# Patient Record
Sex: Female | Born: 1990 | Race: White | Hispanic: No | Marital: Married | Smoking: Former smoker
Health system: Southern US, Community
[De-identification: ages and names within clinical notes are randomized; demographics above are authoritative.]

## PROBLEM LIST (undated history)

## (undated) DIAGNOSIS — F329 Major depressive disorder, single episode, unspecified: Secondary | ICD-10-CM

## (undated) DIAGNOSIS — F419 Anxiety disorder, unspecified: Secondary | ICD-10-CM

## (undated) DIAGNOSIS — F4310 Post-traumatic stress disorder, unspecified: Secondary | ICD-10-CM

## (undated) HISTORY — PX: NO PAST SURGERIES: SHX2092

---

## 2014-09-23 ENCOUNTER — Encounter (INDEPENDENT_AMBULATORY_CARE_PROVIDER_SITE_OTHER): Payer: Self-pay

## 2014-09-23 ENCOUNTER — Ambulatory Visit (INDEPENDENT_AMBULATORY_CARE_PROVIDER_SITE_OTHER): Payer: Enrolled Prime—HMO | Admitting: Family

## 2014-09-23 VITALS — BP 115/74 | HR 78 | Temp 98.2°F | Resp 16 | Ht 63.0 in | Wt 106.0 lb

## 2014-09-23 DIAGNOSIS — H6123 Impacted cerumen, bilateral: Secondary | ICD-10-CM

## 2014-09-23 NOTE — Patient Instructions (Signed)
Thank you for choosing [facility]  Please follow up with your primary care doctor in 3-4 days.   Return to the clinic or Emergency Department for worsening symptoms.    Please remember to wash your hands it is the best way to reduce illness and the spread of germs.           Earwax, Home Treatment    Everyone produces earwax from the lining of the ear canal. It serves to lubricate and protect the ear. The wax that forms in the canal naturally moves toward the outside of the ear and falls out. Sometimes the ear canal may contain too much wax. This can cause a blockage and loss of hearing. Directions are given below for home treatment.  Home care  If your doctor has advised you to remove a wax blockage yourself, follow these directions:   Unless a medicine was prescribed, you may use an over-the-counter product made for clearing earwax. These contain carbamide peroxide. Lie down with the blocked ear facing upward. Apply one dropper full of medicine and wait a few minutes. Grasp the outer ear and wiggle itto helpthe solutionenter the canal.   Lean over a sink or basin with the blocked ear facing downward. Use abulb syringe filled with warm (not hot or cold) water to rinse the ear several times. Use gentle pressure only.   If you are having trouble draining the water out of your ear canal, put a few drops of rubbing alcohol (isopropyl alcohol) into the ear canal. This will help remove the remaining water.   Repeat this procedure once a day for up to three days, or until your hearing is back to normal. Do not use this treatment for more than three days in a row.  Don'ts   Don't use cold water to rinse the ear. This will make you dizzy.   Don't perform this procedure if you have an ear infection.   Don't perform this procedure if you have a ruptured eardrum.   Don't use cotton swabs, matches, hairpins, keys, or other objects to "clean" the ear canal. This can cause infection of the ear canal or  rupturethe eardrum. Because of their size and shape, cotton swabs can push earwax deeper into the ear canal instead of removing it.  Follow-up care  Follow up with your health care provider if you are not improving after three cleaning attempts, or as advised.  When to seek medical advice  Call your health care provider right away if any of these occur:   Worsening ear pain   Fever of 101F (38.3C) or higher, or as directed by your health care provider   Hearing does not return to normal after three days of treatment   Fluid drainage or bleeding from the ear canal   Swelling, redness, or tenderness of the outer ear   Headache, neck pain, or stiff neck   2000-2015 [employer] [address], [place] [address]. All rights reserved. This information is not intended as a substitute for professional medical care. Always follow your healthcare professional's instructions.

## 2014-09-23 NOTE — Progress Notes (Signed)
Subjective:       Patient ID: Jeanette Fitzgerald is a 24 y.o. female present with complaint of not being able to hear well. Symptoms has been going on for about a year but has gotten worse since week.  Ear Fullness   There is pain in both ears. The current episode started more than 1 year ago. The problem occurs constantly. The problem has been gradually worsening. There has been no fever. Associated symptoms include hearing loss. Pertinent negatives include no abdominal pain, coughing, diarrhea, ear discharge, headaches, neck pain, rash, rhinorrhea, sore throat or vomiting. She has tried nothing for the symptoms. There is no history of a chronic ear infection or hearing loss.       The following portions of the patient's history were reviewed and updated as appropriate: allergies, current medications, past family history, past medical history, past social history, past surgical history and problem list.    Review of Systems   Constitutional: Negative.    HENT: Positive for hearing loss. Negative for ear discharge, rhinorrhea and sore throat.    Eyes: Negative.    Respiratory: Negative.  Negative for cough.    Cardiovascular: Negative.    Gastrointestinal: Negative.  Negative for vomiting, abdominal pain and diarrhea.   Endocrine: Positive for polyuria.   Genitourinary: Negative.    Musculoskeletal: Negative.  Negative for neck pain.   Skin: Negative for rash.   Neurological: Negative.  Negative for headaches.         Objective:     Physical Exam   Nursing note and vitals reviewed.  Constitutional: She is oriented to person, place, and time. She appears well-developed and well-nourished.   HENT:   Head: Normocephalic and atraumatic.   Cerumen impaction in bilateral ears     Eyes: Pupils are equal, round, and reactive to light.   Neck: Neck supple.   Cardiovascular: Normal rate and regular rhythm.    Pulmonary/Chest: Effort normal.   Abdominal: Soft.   Musculoskeletal: Normal range of motion.   Neurological: She is  alert and oriented to person, place, and time.   Skin: Skin is warm and dry.   Psychiatric: She has a normal mood and affect.   BP 115/74 mmHg  Pulse 78  Temp(Src) 98.2 F (36.8 C) (Tympanic)  Resp 16  Ht 1.6 m (5\' 3" )  Wt 48.081 kg (106 lb)  BMI 18.78 kg/m2  LMP 09/09/2014 (Approximate)          Assessment:       1. Bilateral impacted cerumen           Plan:       Ear irrigated and inspected after irrigation   Patient can now hear better   Return to urgent care clinic for worsening symptoms   Follow up with your PCP in 3-4 days  Pt in agreement with discharge plan, all questions answered

## 2015-06-13 ENCOUNTER — Emergency Department
Admission: EM | Admit: 2015-06-13 | Discharge: 2015-06-14 | Disposition: A | Discharge status: Home / Self Care | Attending: Emergency Medicine | Admitting: Emergency Medicine

## 2015-06-13 ENCOUNTER — Emergency Department

## 2015-06-13 ENCOUNTER — Encounter: Payer: Self-pay | Admitting: Emergency Medicine

## 2015-06-13 DIAGNOSIS — R1032 Left lower quadrant pain: Secondary | ICD-10-CM | POA: Insufficient documentation

## 2015-06-13 DIAGNOSIS — R109 Unspecified abdominal pain: Secondary | ICD-10-CM

## 2015-06-13 DIAGNOSIS — R5383 Other fatigue: Secondary | ICD-10-CM | POA: Insufficient documentation

## 2015-06-13 DIAGNOSIS — R5381 Other malaise: Secondary | ICD-10-CM

## 2015-06-13 LAB — COMPREHENSIVE METABOLIC PANEL
ALBUMIN: 5 g/dL (ref 3.5–5.0)
ALK PHOS: 51 U/L (ref 38–126)
ALT: 10 U/L — ABNORMAL LOW (ref 14–54)
ANION GAP: 4 — AB (ref 5–15)
AST: 21 U/L (ref 15–41)
BUN: 12 mg/dL (ref 6–20)
CALCIUM: 9.2 mg/dL (ref 8.9–10.3)
CHLORIDE: 106 mmol/L (ref 101–111)
CO2: 25 mmol/L (ref 22–32)
Creatinine, Ser: 0.61 mg/dL (ref 0.44–1.00)
GFR calc Af Amer: >60 mL/min (ref >60)
GFR calc non Af Amer: >60 mL/min (ref >60)
GLUCOSE: 94 mg/dL (ref 65–99)
Potassium: 3.5 mmol/L (ref 3.5–5.1)
SODIUM: 135 mmol/L (ref 135–145)
Total Bilirubin: 2 mg/dL — ABNORMAL HIGH (ref 0.3–1.2)
Total Protein: 8.1 g/dL (ref 6.5–8.1)

## 2015-06-13 LAB — URINALYSIS COMPLETE WITH MICROSCOPIC (ARMC ONLY)
BACTERIA UA: NONE SEEN
Bilirubin Urine: NEGATIVE
Glucose, UA: NEGATIVE mg/dL
Hgb urine dipstick: NEGATIVE
Ketones, ur: NEGATIVE mg/dL
Leukocytes, UA: NEGATIVE
Nitrite: NEGATIVE
PH: 6 (ref 5.0–8.0)
PROTEIN: NEGATIVE mg/dL
Specific Gravity, Urine: 1.017 (ref 1.005–1.030)

## 2015-06-13 LAB — CBC
HEMATOCRIT: 40.2 % (ref 35.0–47.0)
HEMOGLOBIN: 13.6 g/dL (ref 12.0–16.0)
MCH: 29.5 pg (ref 26.0–34.0)
MCHC: 33.9 g/dL (ref 32.0–36.0)
MCV: 87.2 fL (ref 80.0–100.0)
Platelets: 250 10*3/uL (ref 150–440)
RBC: 4.61 MIL/uL (ref 3.80–5.20)
RDW: 12.6 % (ref 11.5–14.5)
WBC: 7.1 10*3/uL (ref 3.6–11.0)

## 2015-06-13 LAB — MONONUCLEOSIS SCREEN: MONO SCREEN: NEGATIVE

## 2015-06-13 LAB — POCT PREGNANCY, URINE: Preg Test, Ur: NEGATIVE

## 2015-06-13 LAB — LIPASE, BLOOD: Lipase: 22 U/L (ref 11–51)

## 2015-06-13 LAB — FIBRIN DERIVATIVES D-DIMER (ARMC ONLY): FIBRIN DERIVATIVES D-DIMER (ARMC): 187 (ref 0–499)

## 2015-06-13 MED ORDER — NAPROXEN 500 MG PO TABS: 500.0000 mg | ORAL_TABLET | Freq: Two times a day (BID) | ORAL | Status: DC

## 2015-06-13 MED ORDER — ONDANSETRON 4 MG PO TBDP: 4.0000 mg | ORAL_TABLET | Freq: Three times a day (TID) | ORAL | Status: DC | PRN

## 2015-06-13 MED ORDER — FAMOTIDINE 20 MG PO TABS: 20.0000 mg | ORAL_TABLET | Freq: Two times a day (BID) | ORAL | Status: DC

## 2015-06-13 NOTE — ED Notes (Signed)
Pt reports she has left side abd pain for 1 week. Pt has IUD.  Last menses was 2 weeks ago and since menses, pt has intermittent abd pain.   No n/v/d.  Pt also has back pain with cramping.  No dysuria.  Pt alert.

## 2015-06-13 NOTE — ED Notes (Signed)
Pt reports 8/10 abdominal pain in LLQ with weakness, poor appetite  and nausea for the last 2 weeks.  Pt has IUD implant approx 3 years ago and concerned that pain is related to that. Pt denies abdominal surgeries.    Pt reports new onset of reflux with these two weeks,  Pain in triage spiking to "10 out of 10"

## 2015-06-13 NOTE — ED Provider Notes (Signed)
-----------------------------------------   11:43 PM on 06/13/2015 -----------------------------------------   Blood pressure 132/80, temperature 98.2 F (36.8 C), temperature source Oral, resp. rate 16, height 5\' 3"  (1.6 m), weight 110 lb (49.896 kg), last menstrual period 05/30/2015, SpO2 99 %.  Assuming care from Dr. Scotty CourtStafford.  In short, Erin Lindsey is a 25 y.o. female with a chief complaint of Abdominal Pain .  Refer to the original H&P for additional details.  The current plan of care is to follow the results of the D-dimer test which was negative. The patient was prepped for discharge. Appropriate discharge paperwork etc. was distributed by the nursing staff. Dimer test but did not reach a height that was of medical or clinical concern.   Jennye MoccasinBrian S Zanobia Griebel, MD 06/13/15 [phone]

## 2015-06-13 NOTE — ED Provider Notes (Signed)
[facility] Emergency Department Provider Note  ____________________________________________  Time seen: 9:10 PM  I have reviewed the triage vital signs and the nursing notes.   HISTORY  Chief Complaint Abdominal Pain    HPI Erin Lindsey is a 25 y.o. female who complains of severe left lower quadrant abdominal pain with generalized weakness poor appetite and nausea for the past 2 weeks. She also reports severe fatigue over the last 2 or 3 months. Concern that this may be related to her IUD that she has had for the past 3 years. No abnormal vaginal bleeding or discharge. No dysuria frequency urgency. No nausea vomiting or diarrhea. She also reports some chest tightness which is present in the anterior chest and feels like heartburn and is nonradiating today that seems to hurt a little bit when she breathes. She also feels mildly short of breath. She took some antacids at home which feels like it did help a little bit. Denies any personal or family history of DVT or PE. No recent travel, hospitalizations or surgeries.     History reviewed. No pertinent past medical history.   There are no active problems to display for this patient.    History reviewed. No pertinent past surgical history.   Current Outpatient Rx  Name  Route  Sig  Dispense  Refill  . famotidine (PEPCID) 20 MG tablet   Oral   Take 1 tablet (20 mg total) by mouth 2 (two) times daily.   60 tablet   0   . naproxen (NAPROSYN) 500 MG tablet   Oral   Take 1 tablet (500 mg total) by mouth 2 (two) times daily with a meal.   20 tablet   0   . ondansetron (ZOFRAN ODT) 4 MG disintegrating tablet   Oral   Take 1 tablet (4 mg total) by mouth every 8 (eight) hours as needed for nausea or vomiting.   20 tablet   0    None  Allergies Review of patient's allergies indicates no known allergies.   History reviewed. No pertinent family history.  Social History Social History   Substance Use Topics  . Smoking status: Never Smoker   . Smokeless tobacco: None  . Alcohol Use: No    Review of Systems  Constitutional:   No fever or chills. No weight changes Eyes:   No vision changes.  ENT:   No sore throat. No rhinorrhea. Cardiovascular:   Positive as above chest pain. Respiratory:   No dyspnea or cough. Gastrointestinal:   Negative for abdominal pain, vomiting and diarrhea.  No BRBPR or melena. Genitourinary:   Negative for dysuria or difficulty urinating. Musculoskeletal:   Negative for focal pain or swelling Skin:   Negative for rash. Neurological:   Negative for headaches, focal weakness or numbness.  10-point ROS otherwise negative.  ____________________________________________   PHYSICAL EXAM:  VITAL SIGNS: ED Triage Vitals  Enc Vitals Group     BP 06/13/15 1949 132/80 mmHg     Pulse --      Resp 06/13/15 1949 16     Temp 06/13/15 1949 98.2 F (36.8 C)     Temp Source 06/13/15 1949 Oral     SpO2 06/13/15 1949 99 %     Weight 06/13/15 1949 110 lb (49.896 kg)     Height 06/13/15 1949 5\' 3"  (1.6 m)     Head Cir --      Peak Flow --      Pain Score 06/13/15 2001  8     Pain Loc --      Pain Edu? --      Excl. in GC? --     Vital signs reviewed, nursing assessments reviewed.   Constitutional:   Alert and oriented. Well appearing and in no distress. Eyes:   No scleral icterus. No conjunctival pallor. PERRL. EOMI ENT   Head:   Normocephalic and atraumatic.   Nose:   No congestion/rhinnorhea. No septal hematoma   Mouth/Throat:   MMM, no pharyngeal erythema. No peritonsillar mass.    Neck:   No stridor. No SubQ emphysema. No meningismus. Hematological/Lymphatic/Immunilogical:   No cervical lymphadenopathy. Cardiovascular:   RRR. Symmetric bilateral radial and DP pulses.  No murmurs.  Respiratory:   Normal respiratory effort without tachypnea nor retractions. Breath sounds are clear and equal bilaterally. No  wheezes/rales/rhonchi. Gastrointestinal:   Soft with very focal and exquisite left upper quadrant tenderness. No appreciable splenomegaly.. Non distended. There is no CVA tenderness.  No rebound, rigidity, or guarding. Genitourinary:   deferred Musculoskeletal:   Nontender with normal range of motion in all extremities. No joint effusions.  No lower extremity tenderness.  No edema. Chest wall nontender. Neurologic:   Normal speech and language.  CN 2-10 normal. Motor grossly intact. No gross focal neurologic deficits are appreciated.  Skin:    Skin is warm, dry and intact. No rash noted.  No petechiae, purpura, or bullae. Psychiatric:   Mood and affect are normal. ____________________________________________    LABS (pertinent positives/negatives) (all labs ordered are listed, but only abnormal results are displayed) Labs Reviewed  COMPREHENSIVE METABOLIC PANEL - Abnormal; Notable for the following:    ALT 10 (*)    Total Bilirubin 2.0 (*)    Anion gap 4 (*)    All other components within normal limits  URINALYSIS COMPLETEWITH MICROSCOPIC ([facility] ONLY) - Abnormal; Notable for the following:    Color, Urine YELLOW (*)    APPearance CLEAR (*)    Squamous Epithelial / LPF 0-5 (*)    All other components within normal limits  LIPASE, BLOOD  CBC  MONONUCLEOSIS SCREEN  FIBRIN DERIVATIVES D-DIMER ([facility] ONLY)  POCT PREGNANCY, URINE   ____________________________________________   EKG    ____________________________________________    RADIOLOGY  Chest x-ray unremarkable  ____________________________________________   PROCEDURES   ____________________________________________   INITIAL IMPRESSION / ASSESSMENT AND PLAN / ED COURSE  Pertinent labs & imaging results that were available during my care of the patient were reviewed by me and considered in my medical decision making (see chart for details).  Patient presents with multiple symptoms, most significant of which  is malaise generalized weakness and profound fatigue. Vital signs are normal. Workup is negative. Initial labs including chemistries blood counts pregnancy test and urinalysis all unremarkable. Mononucleosis screen and d-dimer pending. If no severe findings are identified, the patient is stable for outpatient follow-up.    ----------------------------------------- 11:06 PM on 06/13/2015 -----------------------------------------  Monoscreen negative. Due to delay in lab actually running the D-dimer test, it is still pending. Case signed out to Dr. Huel Cote to follow-up on results. Results discussed with the patient understands that if d-dimer is negative, we'll discharge her home to follow up with primary care. She is working on getting a primary care doctor in this area. She just moved to the area. If d-dimer is elevated we'll proceed with CT angiogram of the chest. Continue NSAIDs and antacids for symptomatic management in the meantime. Remains hemodynamically stable in the ED.   ____________________________________________  FINAL CLINICAL IMPRESSION(S) / ED DIAGNOSES  Final diagnoses:  Left sided abdominal pain  Malaise and fatigue      Sharman Cheek, MD 06/13/15 2307

## 2015-06-13 NOTE — Discharge Instructions (Signed)
Abdominal Pain, Adult Many things can cause abdominal pain. Usually, abdominal pain is not caused by a disease and will improve without treatment. It can often be observed and treated at home. Your health care provider will do a physical exam and possibly order blood tests and X-rays to help determine the seriousness of your pain. However, in many cases, more time must pass before a clear cause of the pain can be found. Before that point, your health care provider may not know if you need more testing or further treatment. HOME CARE INSTRUCTIONS Monitor your abdominal pain for any changes. The following actions may help to alleviate any discomfort you are experiencing:  Only take over-the-counter or prescription medicines as directed by your health care provider.  Do not take laxatives unless directed to do so by your health care provider.  Try a clear liquid diet (broth, tea, or water) as directed by your health care provider. Slowly move to a bland diet as tolerated. SEEK MEDICAL CARE IF:  You have unexplained abdominal pain.  You have abdominal pain associated with nausea or diarrhea.  You have pain when you urinate or have a bowel movement.  You experience abdominal pain that wakes you in the night.  You have abdominal pain that is worsened or improved by eating food.  You have abdominal pain that is worsened with eating fatty foods.  You have a fever. SEEK IMMEDIATE MEDICAL CARE IF:  Your pain does not go away within 2 hours.  You keep throwing up (vomiting).  Your pain is felt only in portions of the abdomen, such as the right side or the left lower portion of the abdomen.  You pass bloody or black tarry stools. MAKE SURE YOU:  Understand these instructions.  Will watch your condition.  Will get help right away if you are not doing well or get worse.   This information is not intended to replace advice given to you by your health care provider. Make sure you discuss  any questions you have with your health care provider.   Document Released: 12/06/2004 Document Revised: 11/17/2014 Document Reviewed: 11/05/2012 Elsevier Interactive Patient Education 2016 Elsevier Inc. Fatigue Fatigue is feeling tired all of the time, a lack of energy, or a lack of motivation. Occasional or mild fatigue is often a normal response to activity or life in general. However, long-lasting (chronic) or extreme fatigue may indicate an underlying medical condition. HOME CARE INSTRUCTIONS  Watch your fatigue for any changes. The following actions may help to lessen any discomfort you are feeling:  Talk to your health care provider about how much sleep you need each night. Try to get the required amount every night.  Take medicines only as directed by your health care provider.  Eat a healthy and nutritious diet. Ask your health care provider if you need help changing your diet.  Drink enough fluid to keep your urine clear or pale yellow.  Practice ways of relaxing, such as yoga, meditation, massage therapy, or acupuncture.  Exercise regularly.   Change situations that cause you stress. Try to keep your work and personal routine reasonable.  Do not abuse illegal drugs.  Limit alcohol intake to no more than 1 drink per day for nonpregnant women and 2 drinks per day for men. One drink equals 12 ounces of beer, 5 ounces of wine, or 1 ounces of hard liquor.  Take a multivitamin, if directed by your health care provider. SEEK MEDICAL CARE IF:   Your fatigue   does not get better.  You have a fever.   You have unintentional weight loss or gain.  You have headaches.   You have difficulty:   Falling asleep.  Sleeping throughout the night.  You feel angry, guilty, anxious, or sad.   You are unable to have a bowel movement (constipation).   You skin is dry.   Your legs or another part of your body is swollen.  SEEK IMMEDIATE MEDICAL CARE IF:   You feel  confused.   Your vision is blurry.  You feel faint or pass out.   You have a severe headache.   You have severe abdominal, pelvic, or back pain.   You have chest pain, shortness of breath, or an irregular or fast heartbeat.   You are unable to urinate or you urinate less than normal.   You develop abnormal bleeding, such as bleeding from the rectum, vagina, nose, lungs, or nipples.  You vomit blood.   You have thoughts about harming yourself or committing suicide.   You are worried that you might harm someone else.    This information is not intended to replace advice given to you by your health care provider. Make sure you discuss any questions you have with your health care provider.   Document Released: 12/24/2006 Document Revised: 03/19/2014 Document Reviewed: 06/30/2013 Elsevier Interactive Patient Education 2016 Elsevier Inc.  

## 2016-01-12 ENCOUNTER — Encounter: Payer: Self-pay | Admitting: *Deleted

## 2016-01-12 ENCOUNTER — Ambulatory Visit
Admission: EM | Admit: 2016-01-12 | Discharge: 2016-01-12 | Disposition: A | Discharge status: Home / Self Care | Attending: Family Medicine | Admitting: Family Medicine

## 2016-01-12 DIAGNOSIS — F419 Anxiety disorder, unspecified: Secondary | ICD-10-CM | POA: Diagnosis not present

## 2016-01-12 DIAGNOSIS — R42 Dizziness and giddiness: Secondary | ICD-10-CM | POA: Diagnosis not present

## 2016-01-12 LAB — RAPID STREP SCREEN (MED CTR MEBANE ONLY): STREPTOCOCCUS, GROUP A SCREEN (DIRECT): NEGATIVE

## 2016-01-12 LAB — PREGNANCY, URINE: Preg Test, Ur: NEGATIVE

## 2016-01-12 MED ORDER — MECLIZINE HCL 12.5 MG PO TABS: 12.5000 mg | ORAL_TABLET | Freq: Three times a day (TID) | ORAL | 0 refills | Status: AC | PRN
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
## 2016-01-12 NOTE — Discharge Instructions (Signed)
Begin taking Prilosec or Prevacid over-the-counter daily for 14 days. Use Antivert when not performing activities requiring concentration and judgment do not take while driving.

## 2016-01-12 NOTE — ED Triage Notes (Signed)
Patient started having symptoms of dizzy, nausea, aches, and hot flashes 2 days ago. Additional symptom of sore throat present.  Patient also wants a pregnancy test.

## 2016-01-13 ENCOUNTER — Telehealth: Payer: Self-pay

## 2016-01-13 MED ORDER — LIDOCAINE VISCOUS 2 % MT SOLN: 20.0000 mL | Freq: Four times a day (QID) | OROMUCOSAL | 0 refills | Status: DC | PRN

## 2016-01-13 NOTE — Telephone Encounter (Signed)
Patient called in today and reports that she is no better. Patient states that sore throat is worse and would like to see if something could be done. I spoke with Dr. Judd Gaudieronty and he advised that she should start viscous lidocaine. I have sent medication in to pharmacy and patient advised. Patient advised that strep culture is still pending.

## 2016-01-15 ENCOUNTER — Telehealth: Payer: Self-pay | Admitting: *Deleted

## 2016-01-15 LAB — CULTURE, GROUP A STREP (THRC)

## 2016-01-15 NOTE — Telephone Encounter (Signed)
Called patient, verified DOB, communicated neg strep culture result. Advised patient to follow up with PCP or [facility] if symptom persist.

## 2016-01-18 ENCOUNTER — Encounter: Payer: Self-pay | Admitting: *Deleted

## 2016-01-18 ENCOUNTER — Ambulatory Visit
Admission: EM | Admit: 2016-01-18 | Discharge: 2016-01-18 | Disposition: A | Discharge status: Home / Self Care | Attending: Family Medicine | Admitting: Family Medicine

## 2016-01-18 DIAGNOSIS — B001 Herpesviral vesicular dermatitis: Secondary | ICD-10-CM

## 2016-01-18 DIAGNOSIS — F419 Anxiety disorder, unspecified: Secondary | ICD-10-CM | POA: Diagnosis not present

## 2016-01-18 HISTORY — DX: Anxiety disorder, unspecified: F41.9

## 2016-01-18 MED ORDER — VALACYCLOVIR HCL 1 G PO TABS: ORAL_TABLET | ORAL | 0 refills | Status: DC

## 2016-01-18 NOTE — ED Provider Notes (Signed)
[facility]    CSN: [id number] Arrival date & time: 01/18/16  1830     History   Chief Complaint Chief Complaint  Patient presents with  . Mouth Lesions  . Abdominal Pain  . Anxiety    HPI Erin Lindsey is a 25 y.o. female.   Patient is here with her SO and son. She reports being very stressed out and very anxious. She states she has a history of anxiety but she cannot take antidepressants. However she is on Lexapro 10 mg a day. She does not know what antidepressants she took before that she did not tolerate but she was fine on Lexapro until recently. Apparently a doctor in [place] initially placed her on Lexapro it was continued by a PCP in [place] who has given her multiple refills which she has been using. She states that recently the Lexapro does not seem to be effective anymore. She is planning to move in the next week or 2 she feels that her job has been treating her unfairly and is broken out in ulcerations in mouth which is also making her very upset and very concerned. She states that her husband was murdered in May in a robbery and since then her life and very extremely stressful.   She's had a history of anxiety no known history of surgeries. She currently smokes (she vapes). No pertinent family history pertinent to today's visit.   The history is provided by the patient and a significant other. No language interpreter was used.  Mouth Lesions  Location:  Upper lip and lower lip Quality:  Ulcerous Onset quality:  Sudden Severity:  Severe Duration:  4 days Progression:  Worsening Chronicity:  New Context: stress   Context: not a change in diet, not a change in medications, not medications, not a possible infection and not trauma   Relieved by:  Nothing Worsened by:  Nothing Abdominal Pain  Associated symptoms: no chest pain and no shortness of breath   Anxiety  This is a recurrent problem. The problem has been rapidly worsening. Associated symptoms  include abdominal pain. Pertinent negatives include no chest pain, no headaches and no shortness of breath. Treatments tried: lexapro.    Past Medical History:  Diagnosis Date  . Anxiety     There are no active problems to display for this patient.   History reviewed. No pertinent surgical history.  OB History    No data available       Home Medications    Prior to Admission medications   Medication Sig Start Date End Date Taking? Authorizing Provider  escitalopram (LEXAPRO) 10 MG tablet Take 10 mg by mouth daily.   Yes Historical Provider, MD  lidocaine (XYLOCAINE) 2 % solution Use as directed 20 mLs in the mouth or throat every 6 (six) hours as needed for mouth pain. 01/13/16  Yes Payton Mccallumrlando Conty, MD  meclizine (ANTIVERT) 12.5 MG tablet Take 1 tablet (12.5 mg total) by mouth 3 (three) times daily as needed for dizziness. 01/12/16  Yes Lutricia FeilWilliam P Roemer, PA-C  valACYclovir (VALTREX) 1000 MG tablet 2 tablet twice 12 hrs apart 01/18/16   Hassan RowanEugene Ketrina Boateng, MD    Family History History reviewed. No pertinent family history.  Social History Social History  Substance Use Topics  . Smoking status: Current Every Day Smoker    Types: E-cigarettes  . Smokeless tobacco: Never Used  . Alcohol use No     Allergies   Patient has no known allergies.   Review  of Systems Review of Systems  HENT: Positive for mouth sores.   Respiratory: Negative for shortness of breath.   Cardiovascular: Negative for chest pain.  Gastrointestinal: Positive for abdominal pain.  Neurological: Negative for headaches.  All other systems reviewed and are negative.    Physical Exam Triage Vital Signs ED Triage Vitals  Enc Vitals Group     BP 01/18/16 1840 111/82     Pulse Rate 01/18/16 1840 84     Resp 01/18/16 1840 16     Temp 01/18/16 1840 98.7 F (37.1 C)     Temp Source 01/18/16 1840 Oral     SpO2 01/18/16 1840 100 %     Weight --      Height --      Head Circumference --      Peak Flow --        Pain Score 01/18/16 1845 8     Pain Loc --      Pain Edu? --      Excl. in GC? --    No data found.   Updated Vital Signs BP 111/82 (BP Location: Left Arm)   Pulse 84   Temp 98.7 F (37.1 C) (Oral)   Resp 16   LMP 12/22/2015   SpO2 100%   Visual Acuity Right Eye Distance:   Left Eye Distance:   Bilateral Distance:    Right Eye Near:   Left Eye Near:    Bilateral Near:     Physical Exam  Constitutional: She is oriented to person, place, and time. She appears well-developed and well-nourished.  HENT:  Head: Normocephalic.  Right Ear: Hearing and external ear normal.  Left Ear: Hearing and external ear normal.  Nose: Nose normal. No mucosal edema.  Mouth/Throat: Uvula is midline. Oral lesions present. Normal dentition.  Multiple ulcerations present on the pink mucosa both upper and lower  Eyes: Pupils are equal, round, and reactive to light.  Neck: Normal range of motion.  Pulmonary/Chest: Effort normal.  Neurological: She is alert and oriented to person, place, and time.  Skin: Skin is warm and dry.  Psychiatric: Her speech is normal. Her mood appears anxious.  Vitals reviewed.    UC Treatments / Results  Labs (all labs ordered are listed, but only abnormal results are displayed) Labs Reviewed - No data to display  EKG  EKG Interpretation None       Radiology No results found.  Procedures Procedures (including critical care time)  Medications Ordered in UC Medications - No data to display   Initial Impression / Assessment and Plan / UC Course  I have reviewed the triage vital signs and the nursing notes.  Pertinent labs & imaging results that were available during my care of the patient were reviewed by me and considered in my medical decision making (see chart for details).  Clinical Course     We had a candid talk. I'm not sure if the mouth lesions are coming from a fever blisters or just other type of bowel ulcer lesions. Suspect  they're not herpetic however she is just so upset the getting worse that even though they've been there for 4 days we will try a round of Valtrex 2 tablets a.m. and 2 tablets in the p.m. of 1000 mg for total 4000 mg to see if it makes a difference. For the anxiety will give her day off tomorrow for her to calm down I'm not going to give any type of benzodiazepines but I  will recommend she contact the doctor who prescribed Lexapro and discussed with him about increasing the dosage from 10 mg to 20 mg of explained to the 20 mg Lexapro may calm her anxiety much more and help. Also explained to her that the Lexapro is antidepressant but does have some released and less significant side effects than probably  anyother the other SSRI.  She did request for me to take her out of work Thursday and Friday which I cannot do.  Final Clinical Impressions(s) / UC Diagnoses   Final diagnoses:  Fever blister  Anxiety disorder, unspecified type    New Prescriptions Discharge Medication List as of 01/18/2016  7:20 PM    START taking these medications   Details  valACYclovir (VALTREX) 1000 MG tablet 2 tablet twice 12 hrs apart, Normal       Note: This dictation was prepared with Dragon dictation along with smaller phrase technology. Any transcriptional errors that result from this process are unintentional.   Hassan RowanEugene Jasman Pfeifle, MD 01/18/16 2123

## 2016-01-18 NOTE — ED Triage Notes (Signed)
Patient started having symptoms of anxiety, mouth ulcers, and stomach burning 4 days ago. Patient reports being under a lot of stress.

## 2016-09-11 IMAGING — CR DG CHEST 2V
2 series · 2 of 2 positions shown · non-contrast
Comparison: None.

CLINICAL DATA: Central chest pain and dyspnea for several hours

EXAM:
CHEST  2 VIEW

[chest pa]
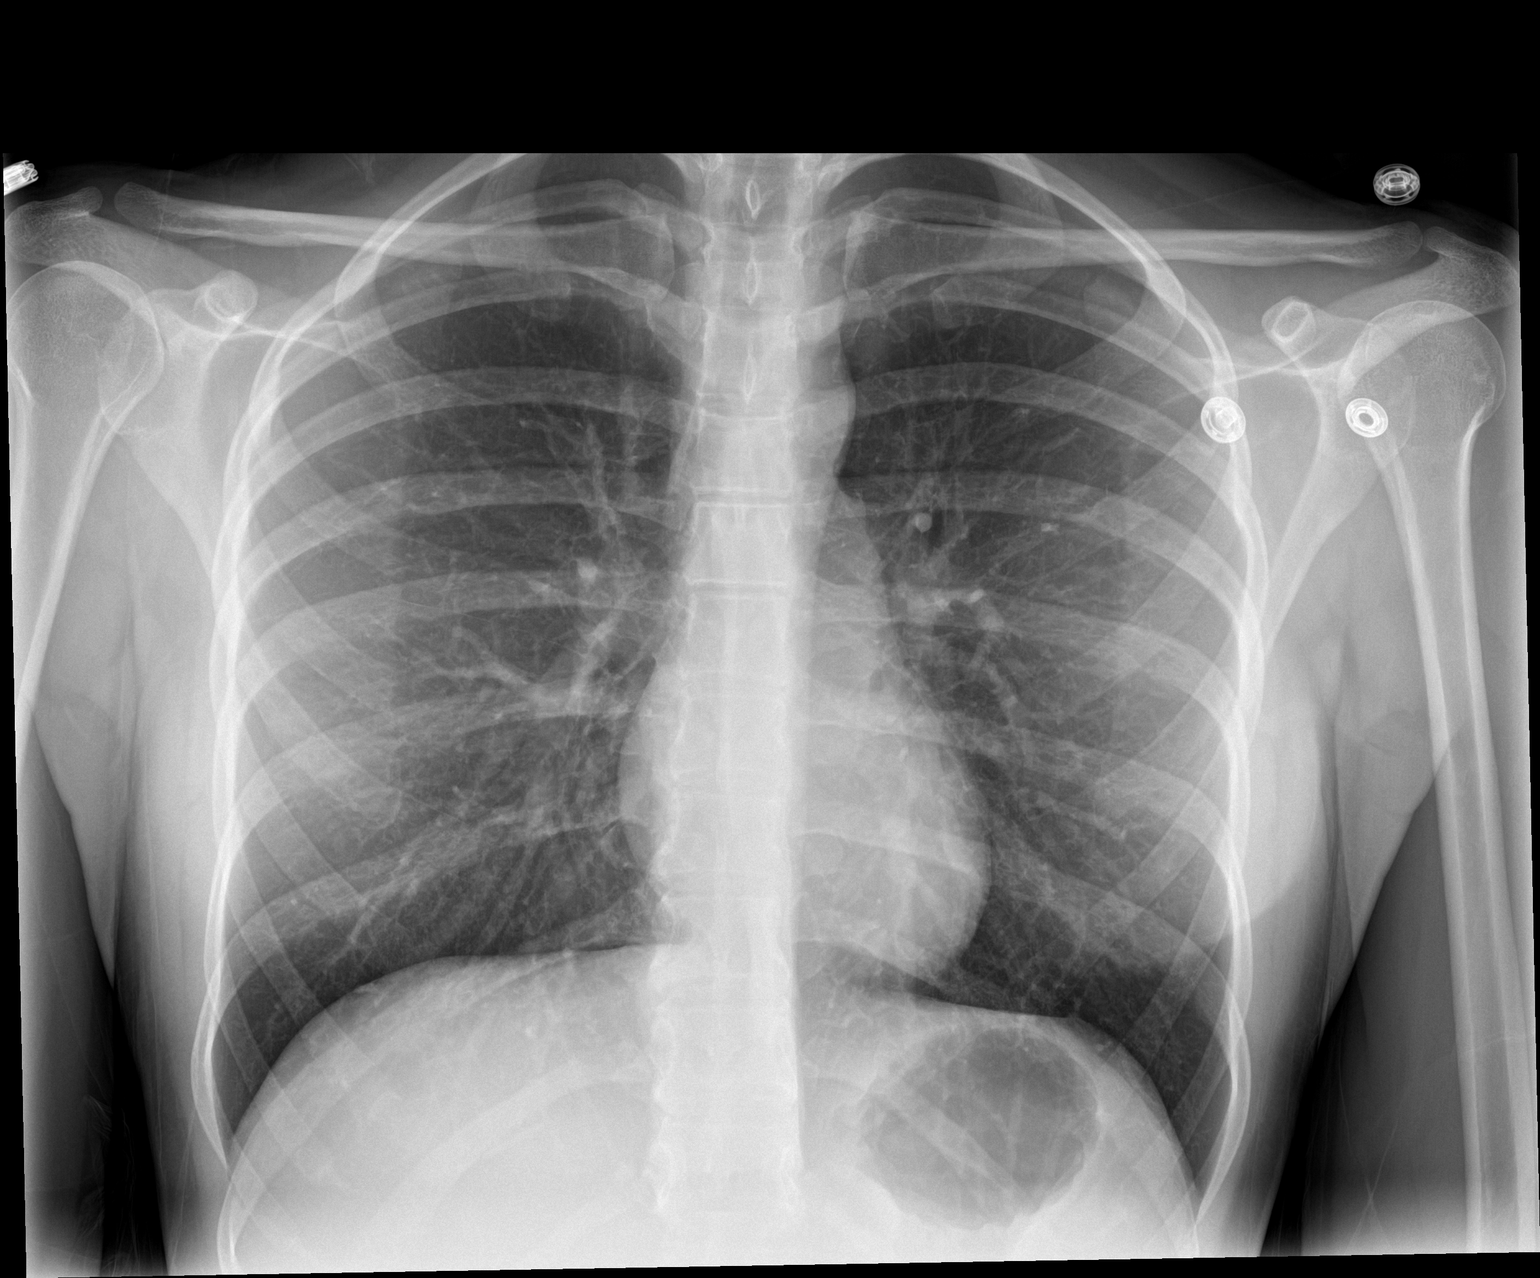

[chest lat]
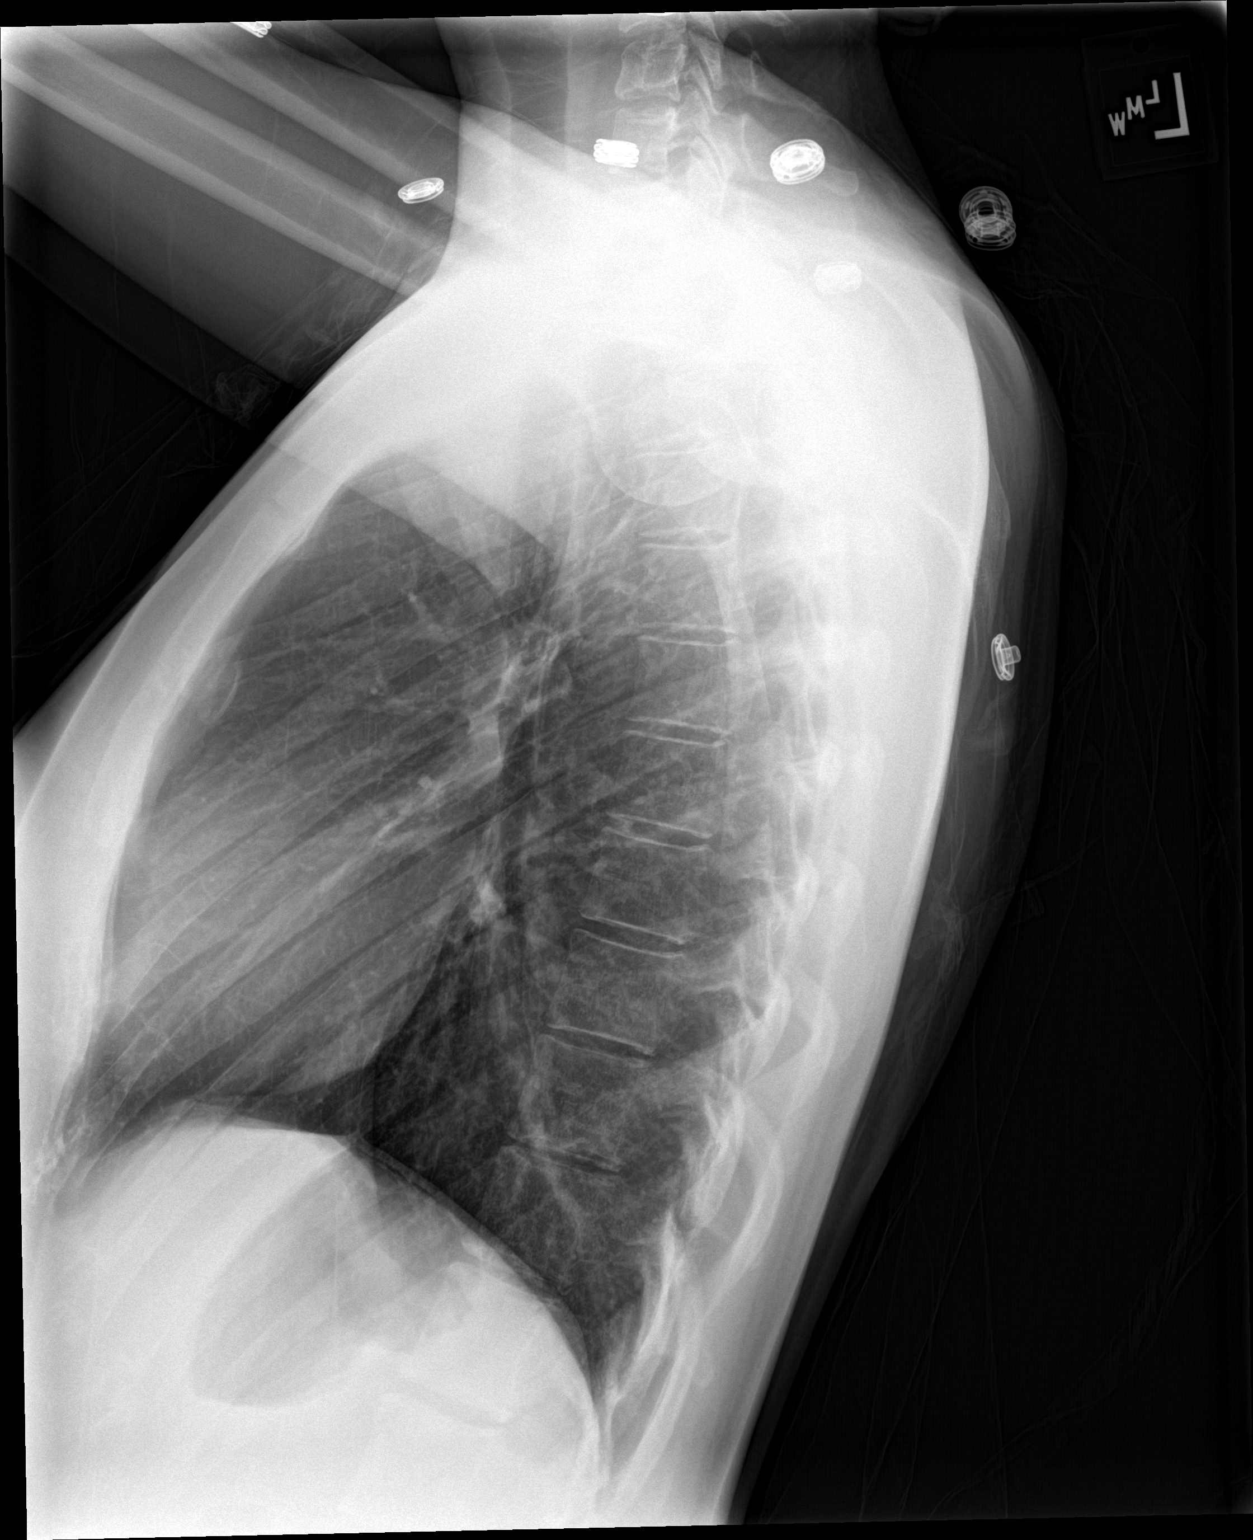

[2 of 2 positions shown; findings below may reference images not displayed]

FINDINGS: The heart size and mediastinal contours are within normal limits.
Both lungs are clear. The visualized skeletal structures are
unremarkable.
IMPRESSION: No active cardiopulmonary disease.

## 2019-03-03 ENCOUNTER — Encounter: Payer: Self-pay | Admitting: Emergency Medicine

## 2019-03-03 ENCOUNTER — Ambulatory Visit
Admission: EM | Admit: 2019-03-03 | Discharge: 2019-03-03 | Disposition: A | Discharge status: Home / Self Care | Payer: Self-pay

## 2019-03-03 ENCOUNTER — Other Ambulatory Visit: Payer: Self-pay

## 2019-03-03 DIAGNOSIS — Z8659 Personal history of other mental and behavioral disorders: Secondary | ICD-10-CM

## 2019-03-03 DIAGNOSIS — Z760 Encounter for issue of repeat prescription: Secondary | ICD-10-CM

## 2019-03-03 DIAGNOSIS — F419 Anxiety disorder, unspecified: Secondary | ICD-10-CM

## 2019-03-03 HISTORY — DX: Major depressive disorder, single episode, unspecified: F32.9

## 2019-03-03 HISTORY — DX: Post-traumatic stress disorder, unspecified: F43.10

## 2019-03-03 MED ORDER — DULOXETINE HCL 30 MG PO CPEP: 30.0000 mg | ORAL_CAPSULE | Freq: Every day | ORAL | 0 refills | Status: AC

## 2019-03-03 MED ORDER — HYDROXYZINE HCL 25 MG PO TABS: 25.0000 mg | ORAL_TABLET | Freq: Three times a day (TID) | ORAL | 0 refills | Status: AC | PRN

## 2019-03-03 NOTE — ED Triage Notes (Signed)
Patient in today requesting refill on medication (Duloxetine) she takes for PTSD. Patient also requesting something for anxiety that she can take prn.

## 2019-03-03 NOTE — Discharge Instructions (Signed)
It was very nice seeing you today in clinic. Thank you for entrusting me with your care.   I have refilled your medication (Cymbalta) and also given you something to help with episodic anxiety. Please use these medications as directed. Follow up with your regular provider and/or counselor for ongoing care. If you have any thoughts of harming yourself or others, please seek help immediately in a emergency department.   If your symptoms/condition worsens, please seek follow up care either here or in the ER. Please remember, our [place] providers are "right here with you" when you need [place].   Again, it was my pleasure to take care of you today. Thank you for choosing our clinic. I hope that you start to feel better quickly.   Honor Loh, MSN, APRN, FNP-C, CEN Advanced Practice Provider [facility]

## 2019-03-04 ENCOUNTER — Encounter: Payer: Self-pay | Admitting: Urgent Care

## 2019-03-04 NOTE — ED Provider Notes (Signed)
[place], [place]   Name: Erin Lindsey DOB: 1990-05-05 MRN: [id number] CSN: [id number] PCP: Patient, No Pcp Per  Arrival date and time:  03/03/19 1244  Chief Complaint:  Anxiety and Medication Refill   NOTE: Prior to seeing the patient today, I have reviewed the triage nursing documentation and vital signs. Clinical staff has updated patient's PMH/PSHx, current medication list, and drug allergies/intolerances to ensure comprehensive history available to assist in medical decision making.   History:   HPI: Erin Lindsey is a 28 y.o. female who presents today with complaints of uncontrolled anxiety. Patient states, "I am anxious. I am always anxious. I live anxious". Patient denies any SI/HI. She reports finding difficulties with "functioning in the day to day" due to her anxiety. Patient with PMH (+) for PTSD, anxiety, and MDD. She resides in [place] and is in town visiting a friend. Patient has been prescribed duloxetine, however has no remaining refills. She has been on this medication since September 2020; formally on escitalopram. She has a single tablet left at this point; takes as prescribed. Due to issues with her insurance ([employer]), she reports that she has been unable to be seen in order to have her medication refilled. Patient advised that her husband died (murdered) 3 years ago and there are military forms that are incorrect saying that he is still alive. Her military ID has expired. All of these things are causing her difficulties with regards to her healthcare. She is asking for a refill of her medication today. Additionally, patient feels as if she needs to have a PRN intervention in place to help with anxiety/panic attacks.  Past Medical History:  Diagnosis Date  . Anxiety   . Major depressive disorder   . PTSD (post-traumatic stress disorder)     Past Surgical History:  Procedure Laterality Date  . NO PAST SURGERIES      Family History  Problem Relation Age of Onset   . Hypertension Mother   . Melanoma Father     Social History   Tobacco Use  . Smoking status: Former Smoker    Types: E-cigarettes    Quit date: 10/2017    Years since quitting: 1.3  . Smokeless tobacco: Never Used  Substance Use Topics  . Alcohol use: Yes    Comment: social  . Drug use: No    There are no problems to display for this patient.   Home Medications:    Current Meds  Medication Sig  . meclizine (ANTIVERT) 12.5 MG tablet Take 1 tablet (12.5 mg total) by mouth 3 (three) times daily as needed for dizziness.  . [DISCONTINUED] DULoxetine (CYMBALTA) 30 MG capsule Take 30 mg by mouth daily.    Allergies:   Patient has no known allergies.  Review of Systems (ROS): Review of Systems  Constitutional: Negative for chills and fever.  Respiratory: Negative for cough and shortness of breath.   Cardiovascular: Negative for chest pain and palpitations.  Gastrointestinal: Negative for abdominal pain, diarrhea, nausea and vomiting.  Psychiatric/Behavioral: Positive for sleep disturbance. Negative for self-injury and suicidal ideas. The patient is nervous/anxious.   All other systems reviewed and are negative.    Vital Signs: Today's Vitals   03/03/19 1316 03/03/19 1317 03/03/19 1347  BP:  124/86   Pulse:  87   Resp:  16   Temp:  98.4 F (36.9 C)   TempSrc:  Oral   SpO2:  100%   Weight: 120 lb (54.4 kg)    Height: 5\' 3"  (1.6  m)    PainSc: 0-No pain  0-No pain    Physical Exam: Physical Exam  Constitutional: She is oriented to person, place, and time and well-developed, well-nourished, and in no distress.  HENT:  Head: Normocephalic and atraumatic.  Eyes: Pupils are equal, round, and reactive to light.  Cardiovascular: Normal rate, regular rhythm, normal heart sounds and intact distal pulses.  Pulmonary/Chest: Effort normal and breath sounds normal.  Neurological: She is alert and oriented to person, place, and time. Gait normal.  Skin: Skin is warm and  dry. No rash noted. She is not diaphoretic.  Psychiatric: Memory, affect and judgment normal. Her mood appears anxious. She expresses no homicidal and no suicidal ideation.  Nursing note and vitals reviewed.   Urgent Care Treatments / Results:   No orders of the defined types were placed in this encounter.   LABS: PLEASE NOTE: all labs that were ordered this encounter are listed, however only abnormal results are displayed. Labs Reviewed - No data to display  EKG: -None  RADIOLOGY: No results found.  PROCEDURES: Procedures  MEDICATIONS RECEIVED THIS VISIT: Medications - No data to display  PERTINENT CLINICAL COURSE NOTES/UPDATES:   Initial Impression / Assessment and Plan / Urgent Care Course:  Pertinent labs & imaging results that were available during my care of the patient were personally reviewed by me and considered in my medical decision making (see lab/imaging section of note for values and interpretations).  Erin Lindsey is a 28 y.o. female who presents to [facility] today with complaints of Anxiety and Medication Refill   Patient is well appearing overall in clinic today. She does not appear to be in any acute distress. Presenting symptoms (see HPI) and exam as documented above. Patient with chronic anxiety. No SI/HI; good insight and judgement. She is on daily SSRI (duloxetine), however has not refills due to insurance issues (see HPI). Patient lives in [place] and is in town visiting friend. Will refill duloxetine at her previously prescribed 30 mg daily dose. Anxiety has been worse as of late due to "life in general" and the upcoming holidays. She is asking for PRN intervention. Discussed limitations on what I can do in the UC setting. She states, "I do not want anything addictive. I used to be on Xanax and I don't want anything like that". Patient visibly anxious in clinic. PRN is felt to be more than appropriate for this patient. Will send in a supply of  hydroxyzine for patient to use on a PRN basis. Discussed need to see PCP and/or counselor for ongoing management of her care when she returns home to [place]. In the interim, discussed that any thought of harming herself or others needs to be addressed on an emergent basis in the nearest emergency department; verbalizes understanding.   I have reviewed the follow up and strict return precautions for any new or worsening symptoms. Patient is aware of symptoms that would be deemed urgent/emergent, and would thus require further evaluation either here or in the emergency department. At the time of discharge, she verbalized understanding and consent with the discharge plan as it was reviewed with her. All questions were fielded by provider and/or clinic staff prior to patient discharge.    Final Clinical Impressions / Urgent Care Diagnoses:   Final diagnoses:  Anxiety  History of posttraumatic stress disorder (PTSD)  Medication refill    New Prescriptions:  [place] Controlled Substance Registry consulted? Not Applicable  Meds ordered this encounter  Medications  .  DULoxetine (CYMBALTA) 30 MG capsule    Sig: Take 1 capsule (30 mg total) by mouth daily.    Dispense:  30 capsule    Refill:  0  . hydrOXYzine (ATARAX/VISTARIL) 25 MG tablet    Sig: Take 1 tablet (25 mg total) by mouth every 8 (eight) hours as needed.    Dispense:  30 tablet    Refill:  0    Recommended Follow up Care:  Patient encouraged to follow up with the following provider within the specified time frame, or sooner as dictated by the severity of her symptoms. As always, she was instructed that for any urgent/emergent care needs, she should seek care either here or in the emergency department for more immediate evaluation.  Follow-up Information    PCP In 1 week.         NOTE: This note was prepared using Scientist, clinical (histocompatibility and immunogenetics)Dragon dictation software along with smaller Lobbyistphrase technology. Despite my best ability to proofread, there is the  potential that transcriptional errors may still occur from this process, and are completely unintentional.    Verlee MonteGray, Vinal Rosengrant E, NP 03/04/19 2357
